# Patient Record
Sex: Female | Born: 1990 | Race: Black or African American | Hispanic: No | Marital: Married | Smoking: Former smoker
Health system: Southern US, Community
[De-identification: ages and names within clinical notes are randomized; demographics above are authoritative.]

## PROBLEM LIST (undated history)

## (undated) DIAGNOSIS — G43909 Migraine, unspecified, not intractable, without status migrainosus: Secondary | ICD-10-CM

## (undated) HISTORY — PX: TUBAL LIGATION: SHX77

---

## 2008-11-28 ENCOUNTER — Emergency Department (HOSPITAL_COMMUNITY): Admission: EM | Admit: 2008-11-28 | Discharge: 2008-11-28 | Payer: Self-pay | Admitting: Emergency Medicine

## 2010-09-21 LAB — URINALYSIS, ROUTINE W REFLEX MICROSCOPIC
Ketones, ur: NEGATIVE mg/dL
Protein, ur: NEGATIVE mg/dL
Urobilinogen, UA: 0.2 mg/dL (ref 0.0–1.0)

## 2010-09-21 LAB — POCT CARDIAC MARKERS
CKMB, poc: <1 ng/mL — ABNORMAL LOW (ref 1.0–8.0)
Troponin i, poc: <0.05 ng/mL (ref 0.00–0.09)

## 2010-09-21 LAB — POCT I-STAT, CHEM 8
BUN: 9 mg/dL (ref 6–23)
Calcium, Ion: 1.22 mmol/L (ref 1.12–1.32)
Chloride: 107 mEq/L (ref 96–112)
Glucose, Bld: 80 mg/dL (ref 70–99)
HCT: 46 % (ref 36.0–46.0)
Potassium: 4.2 mEq/L (ref 3.5–5.1)

## 2010-09-21 LAB — URINE MICROSCOPIC-ADD ON

## 2010-09-21 LAB — D-DIMER, QUANTITATIVE: D-Dimer, Quant: 0.32 ug/mL-FEU (ref 0.00–0.48)

## 2010-09-21 LAB — POCT PREGNANCY, URINE: Preg Test, Ur: NEGATIVE

## 2012-02-20 ENCOUNTER — Emergency Department: Payer: Self-pay | Admitting: *Deleted

## 2012-02-20 LAB — CBC
HCT: 42.6 % (ref 35.0–47.0)
MCH: 27.4 pg (ref 26.0–34.0)
MCV: 84 fL (ref 80–100)
Platelet: 301 10*3/uL (ref 150–440)
RDW: 14.8 % — ABNORMAL HIGH (ref 11.5–14.5)
WBC: 16.5 10*3/uL — ABNORMAL HIGH (ref 3.6–11.0)

## 2012-02-20 LAB — BASIC METABOLIC PANEL
BUN: 9 mg/dL (ref 7–18)
Chloride: 105 mmol/L (ref 98–107)
EGFR (Non-African Amer.): >60
Glucose: 93 mg/dL (ref 65–99)
Potassium: 3.4 mmol/L — ABNORMAL LOW (ref 3.5–5.1)
Sodium: 139 mmol/L (ref 136–145)

## 2012-02-20 LAB — URINALYSIS, COMPLETE
Bilirubin,UR: NEGATIVE
Nitrite: POSITIVE
Ph: 5 (ref 4.5–8.0)

## 2012-02-22 LAB — URINE CULTURE

## 2012-10-19 ENCOUNTER — Inpatient Hospital Stay: Payer: Self-pay | Admitting: Specialist

## 2012-10-19 LAB — COMPREHENSIVE METABOLIC PANEL
Alkaline Phosphatase: 86 U/L (ref 50–136)
BUN: 16 mg/dL (ref 7–18)
Bilirubin,Total: 0.5 mg/dL (ref 0.2–1.0)
Calcium, Total: 8.8 mg/dL (ref 8.5–10.1)
Creatinine: 1.15 mg/dL (ref 0.60–1.30)
SGOT(AST): 29 U/L (ref 15–37)
SGPT (ALT): 22 U/L (ref 12–78)
Sodium: 136 mmol/L (ref 136–145)
Total Protein: 8.4 g/dL — ABNORMAL HIGH (ref 6.4–8.2)

## 2012-10-19 LAB — URINALYSIS, COMPLETE
Bilirubin,UR: NEGATIVE
Glucose,UR: NEGATIVE mg/dL (ref 0–75)
Ph: 6 (ref 4.5–8.0)
WBC UR: 194 /HPF (ref 0–5)

## 2012-10-19 LAB — CBC
Platelet: 220 10*3/uL (ref 150–440)
RBC: 4.27 10*6/uL (ref 3.80–5.20)
RDW: 14.5 % (ref 11.5–14.5)

## 2012-10-19 LAB — LIPASE, BLOOD: Lipase: 121 U/L (ref 73–393)

## 2012-10-19 LAB — PREGNANCY, URINE: Pregnancy Test, Urine: NEGATIVE m[IU]/mL

## 2012-10-20 LAB — CBC WITH DIFFERENTIAL/PLATELET
Basophil #: 0 10*3/uL (ref 0.0–0.1)
Basophil %: 0.1 %
Eosinophil #: 0.3 10*3/uL (ref 0.0–0.7)
HGB: 11.6 g/dL — ABNORMAL LOW (ref 12.0–16.0)
Lymphocyte #: 1.4 10*3/uL (ref 1.0–3.6)
Lymphocyte %: 10.6 %
MCH: 29.9 pg (ref 26.0–34.0)
MCV: 85 fL (ref 80–100)
Monocyte #: 1 x10 3/mm — ABNORMAL HIGH (ref 0.2–0.9)
Monocyte %: 7.9 %
Platelet: 190 10*3/uL (ref 150–440)
RDW: 14.4 % (ref 11.5–14.5)
WBC: 12.9 10*3/uL — ABNORMAL HIGH (ref 3.6–11.0)

## 2012-10-20 LAB — BASIC METABOLIC PANEL
Chloride: 112 mmol/L — ABNORMAL HIGH (ref 98–107)
Creatinine: 0.77 mg/dL (ref 0.60–1.30)
EGFR (African American): >60
EGFR (Non-African Amer.): >60
Glucose: 91 mg/dL (ref 65–99)
Sodium: 142 mmol/L (ref 136–145)

## 2012-10-21 LAB — CBC WITH DIFFERENTIAL/PLATELET
Basophil #: 0 10*3/uL (ref 0.0–0.1)
Basophil %: 0.1 %
HCT: 29.8 % — ABNORMAL LOW (ref 35.0–47.0)
Lymphocyte #: 1.9 10*3/uL (ref 1.0–3.6)
MCHC: 34.6 g/dL (ref 32.0–36.0)
Monocyte %: 12.9 %
Neutrophil %: 71 %
Platelet: 169 10*3/uL (ref 150–440)
RBC: 3.53 10*6/uL — ABNORMAL LOW (ref 3.80–5.20)
RDW: 14.5 % (ref 11.5–14.5)

## 2012-10-21 LAB — URINE CULTURE

## 2012-10-25 LAB — CULTURE, BLOOD (SINGLE)

## 2013-11-13 ENCOUNTER — Emergency Department: Payer: Self-pay | Admitting: Emergency Medicine

## 2013-11-13 LAB — CBC WITH DIFFERENTIAL/PLATELET
BASOS ABS: 0 10*3/uL (ref 0.0–0.1)
BASOS PCT: 0.4 %
EOS ABS: 0.3 10*3/uL (ref 0.0–0.7)
EOS PCT: 3.3 %
HCT: 38.6 % (ref 35.0–47.0)
HGB: 12.9 g/dL (ref 12.0–16.0)
LYMPHS PCT: 13.2 %
Lymphocyte #: 1.4 10*3/uL (ref 1.0–3.6)
MCH: 29.7 pg (ref 26.0–34.0)
MCHC: 33.5 g/dL (ref 32.0–36.0)
MCV: 89 fL (ref 80–100)
Monocyte #: 0.9 x10 3/mm (ref 0.2–0.9)
Monocyte %: 8.8 %
NEUTROS ABS: 7.9 10*3/uL — AB (ref 1.4–6.5)
NEUTROS PCT: 74.3 %
Platelet: 264 10*3/uL (ref 150–440)
RBC: 4.34 10*6/uL (ref 3.80–5.20)
RDW: 14.4 % (ref 11.5–14.5)
WBC: 10.6 10*3/uL (ref 3.6–11.0)

## 2013-11-13 LAB — COMPREHENSIVE METABOLIC PANEL
ALBUMIN: 3.9 g/dL (ref 3.4–5.0)
ALK PHOS: 69 U/L
ANION GAP: 3 — AB (ref 7–16)
AST: 26 U/L (ref 15–37)
BILIRUBIN TOTAL: 0.5 mg/dL (ref 0.2–1.0)
BUN: 8 mg/dL (ref 7–18)
CHLORIDE: 108 mmol/L — AB (ref 98–107)
CO2: 28 mmol/L (ref 21–32)
CREATININE: 0.95 mg/dL (ref 0.60–1.30)
Calcium, Total: 8.7 mg/dL (ref 8.5–10.1)
EGFR (African American): >60
Glucose: 91 mg/dL (ref 65–99)
Osmolality: 275 (ref 275–301)
POTASSIUM: 4.1 mmol/L (ref 3.5–5.1)
SGPT (ALT): 21 U/L (ref 12–78)
SODIUM: 139 mmol/L (ref 136–145)
Total Protein: 7.9 g/dL (ref 6.4–8.2)

## 2013-11-13 LAB — LIPASE, BLOOD: Lipase: 102 U/L (ref 73–393)

## 2013-11-14 LAB — URINALYSIS, COMPLETE
BACTERIA: NONE SEEN
Bilirubin,UR: NEGATIVE
GLUCOSE, UR: NEGATIVE mg/dL (ref 0–75)
KETONE: NEGATIVE
Leukocyte Esterase: NEGATIVE
Nitrite: NEGATIVE
Ph: 7 (ref 4.5–8.0)
Protein: NEGATIVE
RBC,UR: 2 /HPF (ref 0–5)
Specific Gravity: 1.01 (ref 1.003–1.030)

## 2014-05-12 ENCOUNTER — Emergency Department: Payer: Self-pay | Admitting: Emergency Medicine

## 2014-05-12 LAB — URINALYSIS, COMPLETE
BILIRUBIN, UR: NEGATIVE
BLOOD: NEGATIVE
Glucose,UR: NEGATIVE mg/dL (ref 0–75)
Ketone: NEGATIVE
NITRITE: NEGATIVE
PH: 5 (ref 4.5–8.0)
PROTEIN: NEGATIVE
Specific Gravity: 1.026 (ref 1.003–1.030)
Squamous Epithelial: 5

## 2014-05-12 LAB — PREGNANCY, URINE: Pregnancy Test, Urine: NEGATIVE m[IU]/mL

## 2014-10-01 IMAGING — CT CT ABD-PELV W/O CM
1 of 2 series · 15 of 32 positions shown, 19 images · non-contrast
Comparison: None

REASON FOR EXAM: (1) Back pain- UTI; (2) Lower abdominal pain- UTI
COMMENTS:

PROCEDURE:     CT  - CT ABDOMEN AND PELVIS W[DATE]  [DATE]
RESULT:     Indication: Flank Pain
TECHNIQUE: Multiple axial images from the lung bases to the symphysis pubis
were obtained without oral and without intravenous contrast.

[Series 2: 3mm soft tissue · axial · 0.69mm/px · z∈[+357,+753]mm · 15 of 146 slices shown, 19 images]
[im 7/146  soft-tissue]
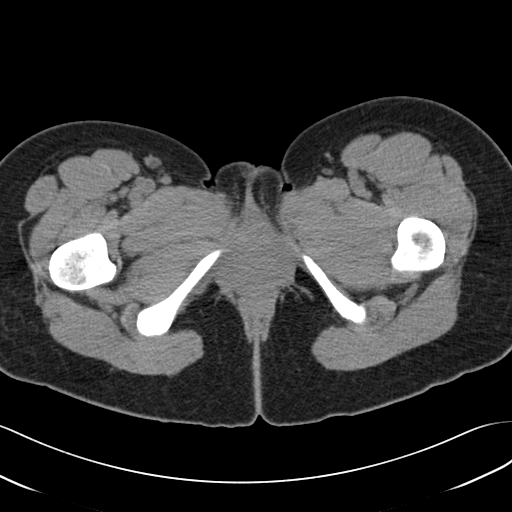
[im 7/146  bone]
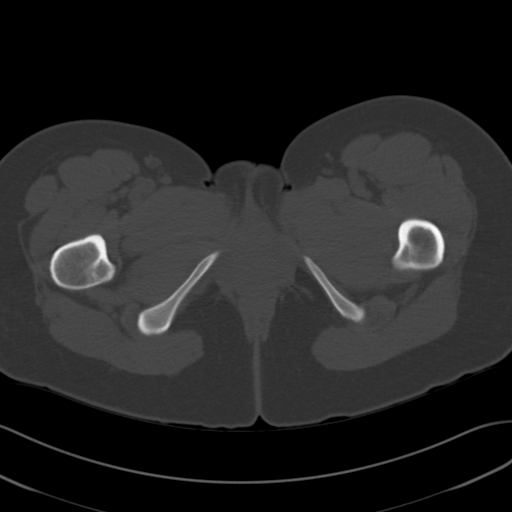
[im 19/146  soft-tissue]
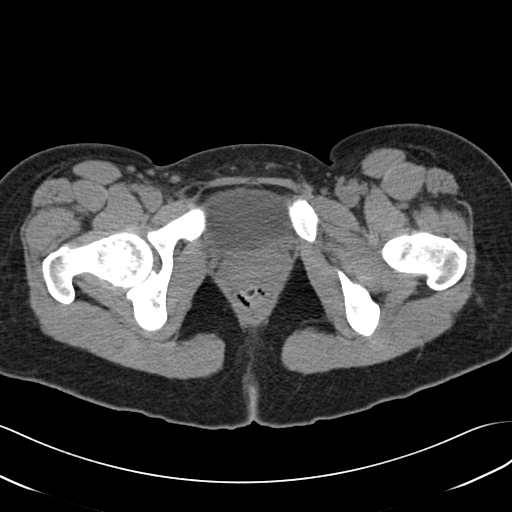
[im 32/146  soft-tissue]
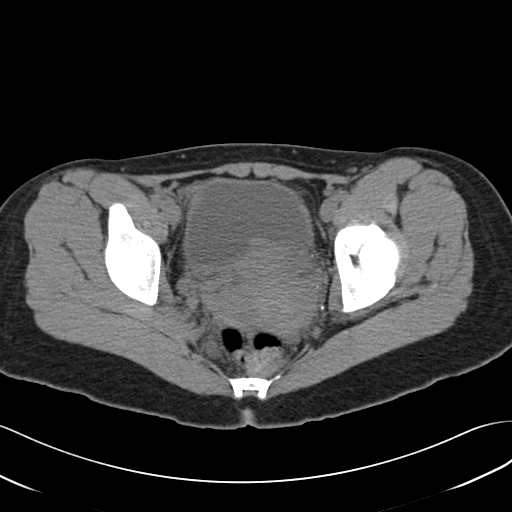
[im 38/146  soft-tissue]
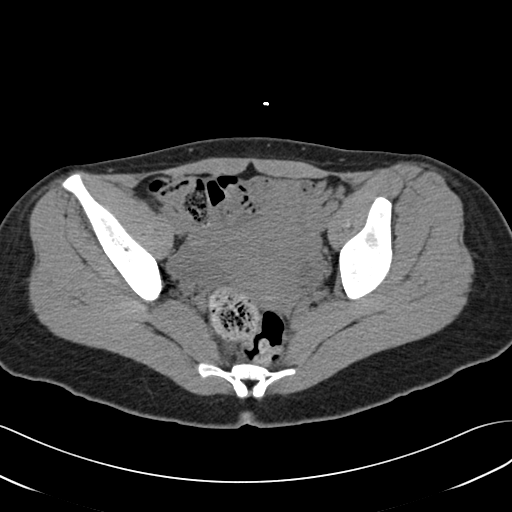
[im 51/146  soft-tissue]
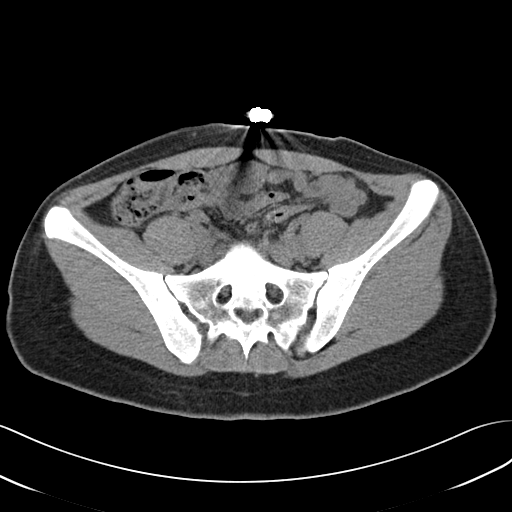
[im 64/146  soft-tissue]
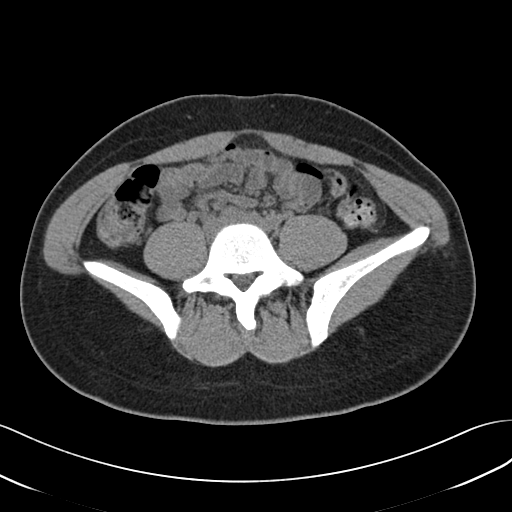
[im 76/146  soft-tissue]
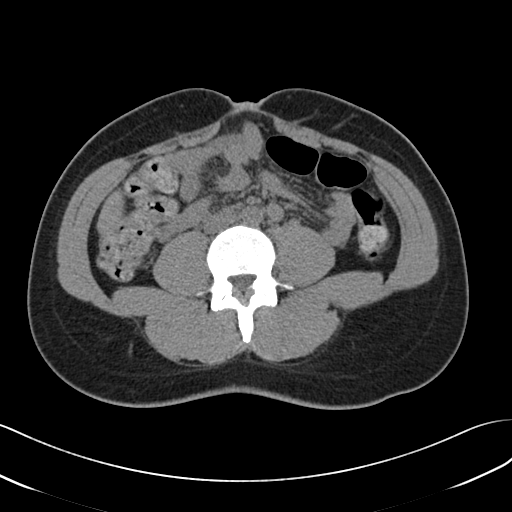
[im 82/146  soft-tissue]
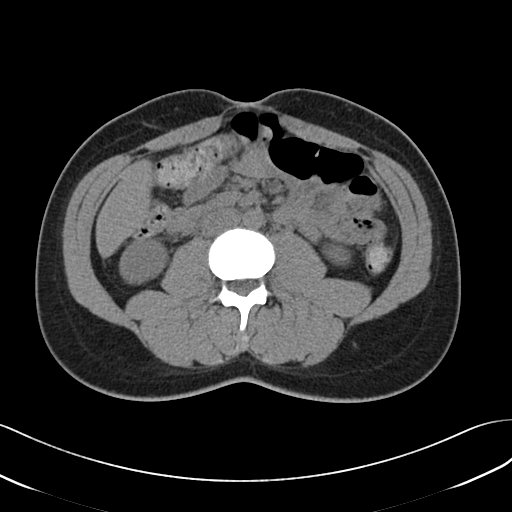
[im 95/146  soft-tissue]
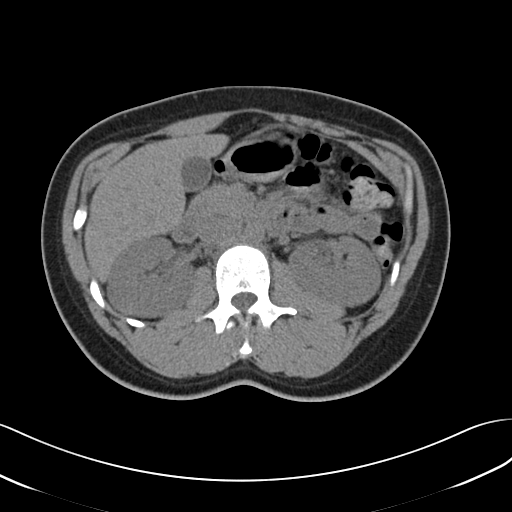
[im 95/146  bone]
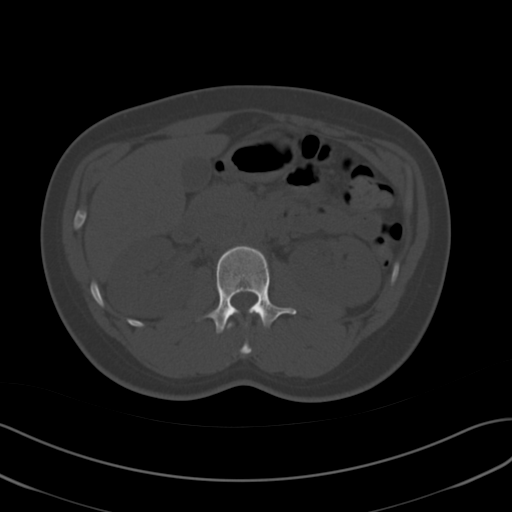
[im 108/146  soft-tissue]
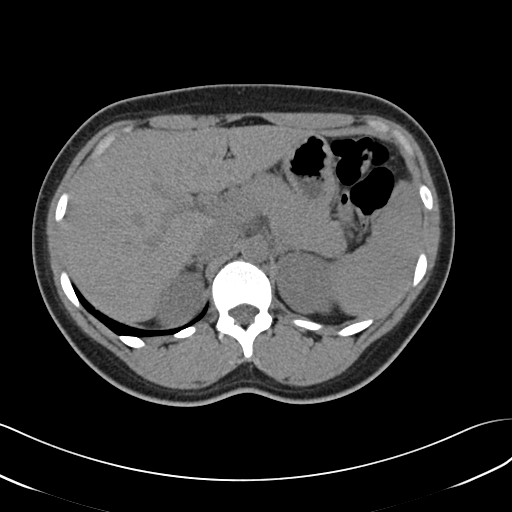
[im 114/146  soft-tissue]
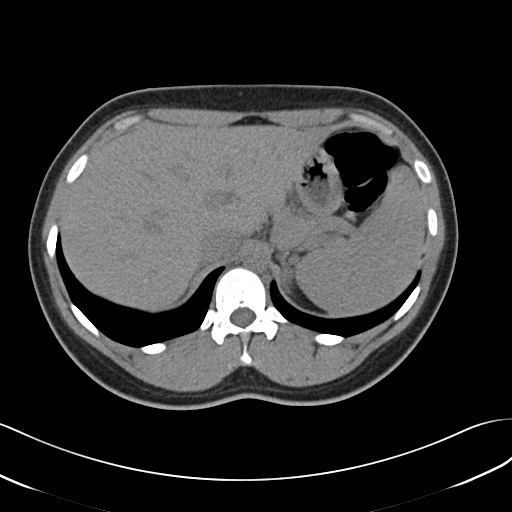
[im 120/146  lung]
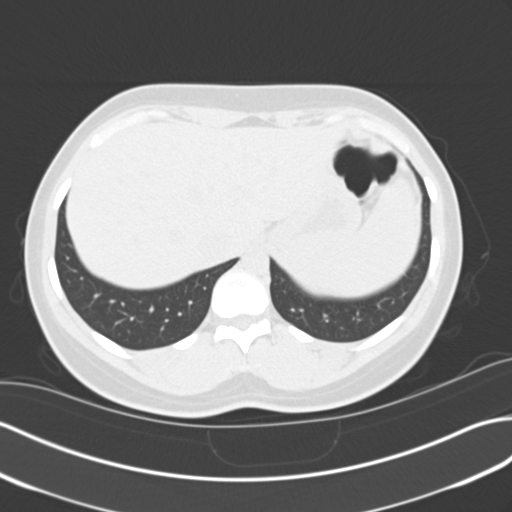
[im 127/146  soft-tissue]
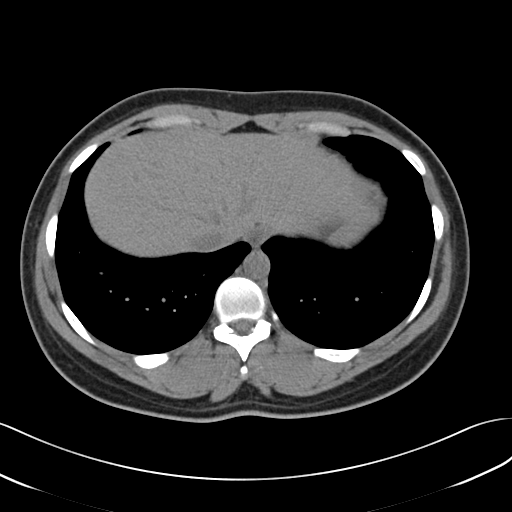
[im 127/146  lung]
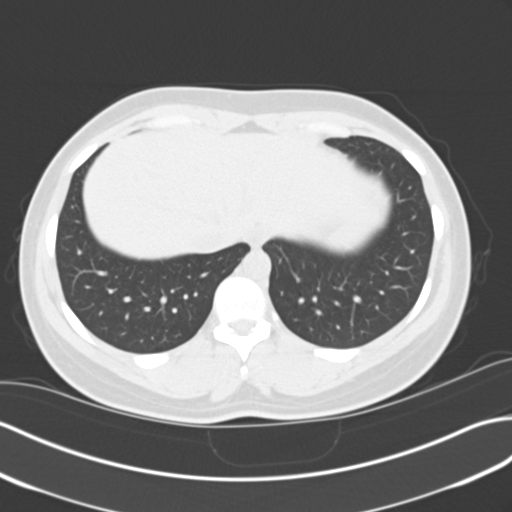
[im 133/146  lung]
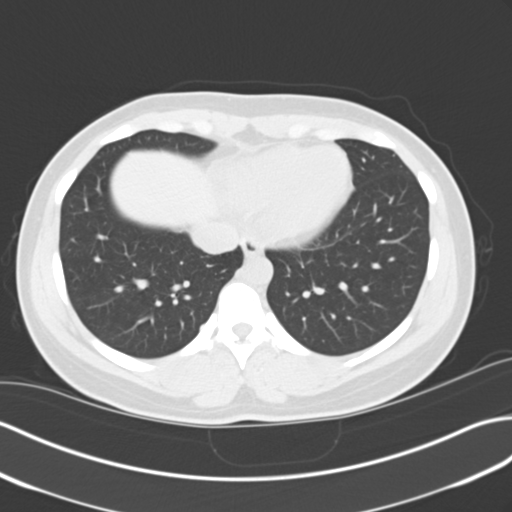
[im 139/146  soft-tissue]
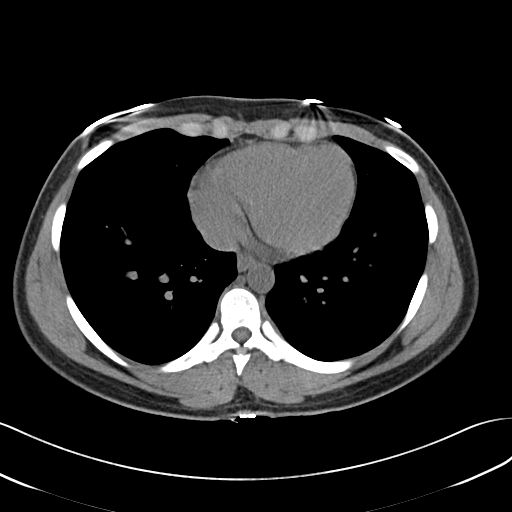
[im 139/146  lung]
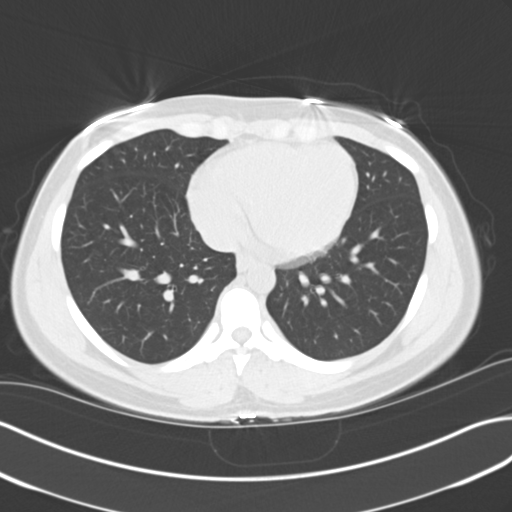

[15 of 32 positions shown; findings below may reference images not displayed]

FINDINGS: The lung bases are clear. There is no pleural or pericardial effusions.

No renal, ureteral, or bladder calculi. No obstructive uropathy. No
perinephric stranding is seen. The kidneys are symmetric in size without
evidence for exophytic mass. The bladder is unremarkable.

The liver demonstrates no focal abnormality. The gallbladder is
unremarkable. The spleen demonstrates no focal abnormality. The adrenal
glands and pancreas are normal.

The unopacified stomach, duodenum, small intestine, and large intestine are
unremarkable, but evaluation is limited by lack of oral contrast.  There is
no pneumoperitoneum, pneumatosis, or portal venous gas. There is small
amount of pelvic free fluid which may be physiologic. There is no
lymphadenopathy.

The abdominal aorta is normal in caliber .

The osseous structures are unremarkable.
IMPRESSION: 1. No urolithiasis or obstructive uropathy.

[REDACTED]

## 2014-10-04 NOTE — Discharge Summary (Signed)
PATIENT NAME:  Gail Mcmahon, Gail Mcmahon MR#:  [id number] DATE OF BIRTH:  01-11-1991  DATE OF ADMISSION:  10/19/2012 DATE OF DISCHARGE:  10/21/2012  For detailed note, please take a look at the history and physical done on admission by  Dr. Elisabeth PigeonVachhani.   DIAGNOSES AT DISCHARGE: Are as follows: Acute pyelonephritis, leukocytosis, secondary to acute pyelonephritis, fever secondary to acute polyarthritis.   The patient is being discharged on a regular diet.   ACTIVITY: As tolerated.   FOLLOWUP: With her primary care physician at [facility] the next 1 to 2 weeks.   DISCHARGE MEDICATIONS: Cipro 500 mg b.i.d. x 10 days, and Tylenol 650 q. 4 to 6 hours as needed for pain or fever of greater than 101.   PERTINENT STUDIES DONE DURING THE HOSPITAL COURSE: Are as follows: A CAT scan of the abdomen and pelvis done without contrast showing no urolithiasis or obstructive uropathy. No evidence of any fluid collections or abscess.   ASSESSMENT AND PLAN: This is a 24 year old female who presented to the hospital on 10/19/2012 secondary to abdominal pain, nausea, vomiting and fever and noted to have an abnormal urinalysis.   1.  Acute pyelonephritis: This was likely the cause of the patient's fever, nausea, abdominal pain. The patient had an abnormal urinalysis. Had failed p.o. antibiotic therapy for her UTI. She was admitted to the hospital, started on IV antibiotics and IV fluids, antiemetics and pain control. Her urinalysis grew out E. coli, which was pansensitive. Her blood cultures have remained negative. After getting 2 days of IV antibiotics and fluids the patient had significantly improved. She now is now being discharged on some p.o. ciprofloxacin and Tylenol for pain or fever.   The patient was also noted to have a leukocytosis of 16,000, which has now normalized to 12,000.  2.  Fever: This was likely related to the acute pyelonephritis. The patient still continues to have some fever spikes, even on the day  of discharge, although she is hemodynamically stable, does not show any evidence of sepsis as blood cultures are negative. It is common to have fever spikes with acute pyelonephritis for up to weeks;  this is normal for the course. The patient will continue her p.o. ciprofloxacin and Tylenol as needed.   THE PATIENT IS A FULL CODE.   Time spent on discharge is 35 minutes.    ____________________________ Rolly PancakeVivek J. Cherlynn KaiserSainani, MD vjs:dm D: 10/21/2012 12:06:12 ET T: 10/22/2012 07:53:22 ET JOB#: [id number]  cc: Rolly PancakeVivek J. Cherlynn KaiserSainani, MD, <Dictator> [facility] [place] SirenVIVEK J SAINANI MD ELECTRONICALLY SIGNED 10/31/2012 19:56

## 2014-10-04 NOTE — H&P (Signed)
PATIENT NAME:  Gail Mcmahon, Gail Mcmahon MR#:  [id number] DATE OF BIRTH:  June 25, 1990  DATE OF ADMISSION:  10/19/2012  REFERRING PHYSICIAN: Dr. Margarita Grizzle  CHIEF COMPLAINT: Back pain and fever,   HISTORY OF PRESENT ILLNESS: The patient is a 24 year old female with no known past medical history started having complaint of back pain and lower abdominal pain on the right side for the last 2 to 3 days and also noted simultaneously that her urine is smelling more intense and funny and is cloudy looking and started having fever today, so decided to come to the Emergency Room for further work-up. On initial work-up, she was found to have a urinary tract infection with tachycardia, fever 101 degrees Fahrenheit in the ER and elevated white cell count. She has complaint of mild nausea also, so she is being admitted for treatment of urinary tract infection with sepsis, possible pyelonephritis for IV antibiotic, due to complaint of nausea and  infection.   REVIEW OF SYSTEMS:  GENERAL: Positive for fever.  No weakness, pain or weight loss.  EYES: No blurring, double vision or redness.  EARS, NOSE, THROAT: No tinnitus, ear pain or hearing loss.  RESPIRATORY: No cough, wheezing or shortness of breath.  CARDIOVASCULAR: No chest pain, orthopnea or arrhythmia.  GASTROINTESTINAL: Had some nausea but no vomiting, diarrhea, have a mild right lower abdominal pain and right back pain.  GENITOURINARY: She has dysuria, no hematuria and has cloudy looking urine.  ENDOCRINE: No heat or cold intolerance.  MUSCULOSKELETAL: No pain in any joints or no swelling of the joints.  NEUROLOGICAL: No numbness, weakness or headache, but has complaint of continuous shaking with fever  PSYCHIATRIC: No anxiety, insomnia or bipolar disorder.   PAST MEDICAL HISTORY: None.   PAST SURGICAL HISTORY: None.   ALLERGIES: None.   HOME MEDICATIONS: None.   FAMILY HISTORY: Positive for diabetes in grandmother.   PHYSICAL EXAMINATION: VITAL SIGNS: In  ER: Temperature 101.9 degrees Fahrenheit, pulse rate 116, respirations 24, blood pressure 127/71, which came down to 104/52.  GENERAL: She is fully alert, oriented to time, place and person and does not have any acute distress. Cooperative with history taking and physical examination.  HEENT: Head and neck atraumatic. Conjunctivae pink.  NECK: Supple. No JVD.  RESPIRATORY: Bilateral clear and equal air entry.  CARDIOVASCULAR: S1, S2 present. Tachycardia. Regular. No murmur appreciated.  ABDOMEN: Mild tenderness in right lower and on minimal tapping on right costovertebral angle, guarding and CVA tenderness present. Bowel sounds present.  SKIN: No rashes. Legs: No edema.  NEUROLOGICAL: Power 5/5 in all 4 limbs. No tremors.  PSYCHIATRIC: Does not appear in any acute psychiatric illness.   LABORATORY, DIAGNOSTIC AND RADIOLOGIC DATA:  Pregnancy test negative. Glucose 99 BUN 16, creatinine 1.15, sodium 136, potassium 3.6, chloride 107, CO2 22, calcium 8.8, lipase 121. Total WBC count 16,600, hemoglobin 12.1, platelet count 220 and MCV 85.   Urinalysis: Color yellow, clarity is hazy and leukocyte esterase 3+, WBC 194.   ASSESSMENT AND PLAN: A 24 year old female came with urinary tract infection and also having complaint of right back and lower abdominal pain with fever and shaking and mild nausea; having sepsis, tachycardia, fever and raised white cell count secondary to urinary tract infection.   PLAN:  1.  Sepsis due to urinary tract infection, possible pyelonephritis. We will do CT of the abdomen and pelvis to see for pyelonephritis and to rule out any renal stones or any other pathology. She is already started on IV Rocephin by ER,  we will continue that. Urine culture and blood cultures are sent off. We need to follow that.  2.  Tachycardia and, sepsis. We will give her IV fluids and monitor clinically.  3.  Nausea. Zofran as needed.  5.  Fever and pain. Tylenol as needed.   TOTAL TIME SPENT: 45  minutes    ____________________________ Gail PigeonVaibhavkumar G. Elisabeth PigeonVachhani, MD vgv:cc D: 10/19/2012 21:33:32 ET T: 10/19/2012 22:15:11 ET JOB#: [id number]  cc: Gail PigeonVaibhavkumar G. Elisabeth PigeonVachhani, MD, <Dictator> Altamese DillingVAIBHAVKUMAR Catherine Oak MD ELECTRONICALLY SIGNED 10/26/2012 6:20

## 2014-11-08 ENCOUNTER — Ambulatory Visit
Admission: EM | Admit: 2014-11-08 | Discharge: 2014-11-08 | Disposition: A | Discharge status: Home / Self Care | Payer: 59 | Attending: Family Medicine | Admitting: Family Medicine

## 2014-11-08 ENCOUNTER — Encounter: Payer: Self-pay | Admitting: Gynecology

## 2014-11-08 DIAGNOSIS — N390 Urinary tract infection, site not specified: Secondary | ICD-10-CM | POA: Diagnosis not present

## 2014-11-08 DIAGNOSIS — F17210 Nicotine dependence, cigarettes, uncomplicated: Secondary | ICD-10-CM | POA: Diagnosis not present

## 2014-11-08 LAB — URINALYSIS COMPLETE WITH MICROSCOPIC (ARMC ONLY)
Glucose, UA: NEGATIVE mg/dL
HGB URINE DIPSTICK: NEGATIVE
NITRITE: NEGATIVE
PH: 5.5 (ref 5.0–8.0)
Protein, ur: 30 mg/dL — AB
SPECIFIC GRAVITY, URINE: 1.03 (ref 1.005–1.030)

## 2014-11-08 MED ORDER — CIPROFLOXACIN HCL 250 MG PO TABS: 250.0000 mg | ORAL_TABLET | Freq: Two times a day (BID) | ORAL | Status: AC

## 2014-11-08 NOTE — ED Provider Notes (Signed)
CSN: [id number]     Arrival date & time 11/08/14  1645 History   First MD Initiated Contact with Patient 11/08/14 1718     Chief Complaint  Patient presents with  . Urinary Tract Infection   (Consider location/radiation/quality/duration/timing/severity/associated sxs/prior Treatment) Patient is a 24 y.o. female presenting with urinary tract infection. The history is provided by the patient.  Urinary Tract Infection Pain quality:  Burning and stabbing Pain severity:  Moderate Onset quality:  Sudden Duration:  8 hours Timing:  Constant Progression:  Worsening Chronicity:  New Recent urinary tract infections: no   Relieved by:  Nothing Urinary symptoms: discolored urine and frequent urination   Associated symptoms: flank pain   Associated symptoms: no abdominal pain, no fever, no nausea, no vaginal discharge and no vomiting     History reviewed. No pertinent past medical history. Past Surgical History  Procedure Laterality Date  . Tubal ligation     No family history on file. History  Substance Use Topics  . Smoking status: Current Every Day Smoker -- 4.00 packs/day    Types: Cigarettes  . Smokeless tobacco: Not on file  . Alcohol Use: No   OB History    No data available     Review of Systems  Constitutional: Negative for fever.  Gastrointestinal: Negative for nausea, vomiting and abdominal pain.  Genitourinary: Positive for flank pain. Negative for vaginal discharge.    Allergies  Review of patient's allergies indicates no known allergies.  Home Medications   Prior to Admission medications   Medication Sig Start Date End Date Taking? Authorizing Provider  ciprofloxacin (CIPRO) 250 MG tablet Take 1 tablet (250 mg total) by mouth every 12 (twelve) hours. 11/08/14   Payton Mccallumrlando Zuma Hust, MD   BP 106/63 mmHg  Pulse 73  Temp(Src) 97.9 F (36.6 C) (Oral)  Ht 5\' 3"  (1.6 m)  Wt 167 lb (75.751 kg)  BMI 29.59 kg/m2  SpO2 100%  LMP 10/22/2014 Physical Exam   Constitutional: She appears well-developed and well-nourished. No distress.  Abdominal: Soft. Bowel sounds are normal. She exhibits no distension and no mass. There is tenderness (mild suprapubic ). There is no rebound and no guarding.  Skin: She is not diaphoretic.  Nursing note and vitals reviewed.   ED Course  Procedures (including critical care time) Labs Review Labs Reviewed  URINALYSIS COMPLETEWITH MICROSCOPIC ([facility] ONLY) - Abnormal; Notable for the following:    Color, Urine AMBER (*)    APPearance CLOUDY (*)    Bilirubin Urine 1+ (*)    Ketones, ur TRACE (*)    Protein, ur 30 (*)    Leukocytes, UA TRACE (*)    Bacteria, UA FEW (*)    Squamous Epithelial / LPF TOO NUMEROUS TO COUNT (*)    All other components within normal limits    Imaging Review No results found.   MDM   1. UTI (lower urinary tract infection)     Discharge Medication List as of 11/08/2014  6:08 PM    START taking these medications   Details  ciprofloxacin (CIPRO) 250 MG tablet Take 1 tablet (250 mg total) by mouth every 12 (twelve) hours., Starting 11/08/2014, Until Discontinued, Normal      Plan: 1. Test/x-ray results and diagnosis reviewed with patient 2. rx as per orders; risks, benefits, potential side effects reviewed with patient 3. Recommend supportive treatment with increased fluids 4. F/u prn if symptoms worsen or don't improve   Payton Mccallumrlando Azrael Maddix, MD 11/10/14 [id number]

## 2014-11-08 NOTE — ED Notes (Signed)
Pt. C/o uniary problems. Pt. Stated low back pain / frequency and painful urination x today.

## 2015-01-16 DIAGNOSIS — G43809 Other migraine, not intractable, without status migrainosus: Secondary | ICD-10-CM | POA: Insufficient documentation

## 2015-01-16 DIAGNOSIS — N390 Urinary tract infection, site not specified: Secondary | ICD-10-CM | POA: Diagnosis not present

## 2015-01-16 DIAGNOSIS — Z3202 Encounter for pregnancy test, result negative: Secondary | ICD-10-CM | POA: Insufficient documentation

## 2015-01-16 DIAGNOSIS — M545 Low back pain: Secondary | ICD-10-CM | POA: Diagnosis present

## 2015-01-16 DIAGNOSIS — Z720 Tobacco use: Secondary | ICD-10-CM | POA: Insufficient documentation

## 2015-01-17 ENCOUNTER — Encounter: Payer: Self-pay | Admitting: Urgent Care

## 2015-01-17 ENCOUNTER — Emergency Department
Admission: EM | Admit: 2015-01-17 | Discharge: 2015-01-17 | Disposition: A | Discharge status: Home / Self Care | Payer: 59 | Attending: Emergency Medicine | Admitting: Emergency Medicine

## 2015-01-17 DIAGNOSIS — G43809 Other migraine, not intractable, without status migrainosus: Secondary | ICD-10-CM

## 2015-01-17 DIAGNOSIS — N390 Urinary tract infection, site not specified: Secondary | ICD-10-CM

## 2015-01-17 HISTORY — DX: Migraine, unspecified, not intractable, without status migrainosus: G43.909

## 2015-01-17 LAB — URINALYSIS COMPLETE WITH MICROSCOPIC (ARMC ONLY)
BILIRUBIN URINE: NEGATIVE
GLUCOSE, UA: NEGATIVE mg/dL
Ketones, ur: NEGATIVE mg/dL
Nitrite: NEGATIVE
PH: 6 (ref 5.0–8.0)
PROTEIN: NEGATIVE mg/dL
Specific Gravity, Urine: 1.012 (ref 1.005–1.030)

## 2015-01-17 LAB — POCT PREGNANCY, URINE: Preg Test, Ur: NEGATIVE

## 2015-01-17 MED ORDER — PROCHLORPERAZINE EDISYLATE 5 MG/ML IJ SOLN
10.0000 mg | Freq: Four times a day (QID) | INTRAMUSCULAR | Status: DC | PRN
  Administered 2015-01-17: 10 mg via INTRAVENOUS
  Filled 2015-01-17: qty 2

## 2015-01-17 MED ORDER — CEPHALEXIN 500 MG PO CAPS: 500.0000 mg | ORAL_CAPSULE | Freq: Three times a day (TID) | ORAL | Status: AC

## 2015-01-17 MED ORDER — DEXTROSE 5 % IV SOLN
1.0000 g | Freq: Once | INTRAVENOUS | Status: AC
  Administered 2015-01-17: 1 g via INTRAVENOUS
  Filled 2015-01-17: qty 10

## 2015-01-17 MED ORDER — SODIUM CHLORIDE 0.9 % IV BOLUS (SEPSIS)
1000.0000 mL | Freq: Once | INTRAVENOUS | Status: AC
  Administered 2015-01-17: 1000 mL via INTRAVENOUS

## 2015-01-17 NOTE — ED Notes (Signed)
Patient present with c/o lower back pain with (+) nausea x 2 -3 days. Patient began to have a migraine headache earlier today; similar to previous.

## 2015-01-17 NOTE — Discharge Instructions (Signed)
Please seek medical attention for any high fevers, chest pain, shortness of breath, change in behavior, persistent vomiting, bloody stool or any other new or concerning symptoms. ° °Urinary Tract Infection °Urinary tract infections (UTIs) can develop anywhere along your urinary tract. Your urinary tract is your body's drainage system for removing wastes and extra water. Your urinary tract includes two kidneys, two ureters, a bladder, and a urethra. Your kidneys are a pair of bean-shaped organs. Each kidney is about the size of your fist. They are located below your ribs, one on each side of your spine. °CAUSES °Infections are caused by microbes, which are microscopic organisms, including fungi, viruses, and bacteria. These organisms are so small that they can only be seen through a microscope. Bacteria are the microbes that most commonly cause UTIs. °SYMPTOMS  °Symptoms of UTIs may vary by age and gender of the patient and by the location of the infection. Symptoms in young women typically include a frequent and intense urge to urinate and a painful, burning feeling in the bladder or urethra during urination. Older women and men are more likely to be tired, shaky, and weak and have muscle aches and abdominal pain. A fever may mean the infection is in your kidneys. Other symptoms of a kidney infection include pain in your back or sides below the ribs, nausea, and vomiting. °DIAGNOSIS °To diagnose a UTI, your caregiver will ask you about your symptoms. Your caregiver also will ask to provide a urine sample. The urine sample will be tested for bacteria and white blood cells. White blood cells are made by your body to help fight infection. °TREATMENT  °Typically, UTIs can be treated with medication. Because most UTIs are caused by a bacterial infection, they usually can be treated with the use of antibiotics. The choice of antibiotic and length of treatment depend on your symptoms and the type of bacteria causing your  infection. °HOME CARE INSTRUCTIONS °· If you were prescribed antibiotics, take them exactly as your caregiver instructs you. Finish the medication even if you feel better after you have only taken some of the medication. °· Drink enough water and fluids to keep your urine clear or pale yellow. °· Avoid caffeine, tea, and carbonated beverages. They tend to irritate your bladder. °· Empty your bladder often. Avoid holding urine for long periods of time. °· Empty your bladder before and after sexual intercourse. °· After a bowel movement, women should cleanse from front to back. Use each tissue only once. °SEEK MEDICAL CARE IF:  °· You have back pain. °· You develop a fever. °· Your symptoms do not begin to resolve within 3 days. °SEEK IMMEDIATE MEDICAL CARE IF:  °· You have severe back pain or lower abdominal pain. °· You develop chills. °· You have nausea or vomiting. °· You have continued burning or discomfort with urination. °MAKE SURE YOU:  °· Understand these instructions. °· Will watch your condition. °· Will get help right away if you are not doing well or get worse. °Document Released: 03/10/2005 Document Revised: 11/30/2011 Document Reviewed: 07/09/2011 °Exit[employer]® Patient Information ©2015 [employer]. This information is not intended to replace advice given to you by your health care provider. Make sure you discuss any questions you have with your health care provider. ° °

## 2015-01-17 NOTE — ED Provider Notes (Signed)
[facility] Emergency Department Provider Note   ____________________________________________  Time seen: 0135  I have reviewed the triage vital signs and the nursing notes.   HISTORY  Chief Complaint Migraine; Nausea; and Back Pain   History limited by: Not Limited   HPI Gail Mcmahon is a 24 y.o. female who presents to the emergency department today because of concerns for back pain, nausea and migraine. She states she's been having back pain for the past 2-3 days. It has been caused. Is been located in the lower back. She says it is an aching pain. In addition she states she developed a migraine today. She does have a history of migraines. She tried taking the Excedrin without any relief. She denies any fevers.     Past Medical History  Diagnosis Date  . Migraine     There are no active problems to display for this patient.   Past Surgical History  Procedure Laterality Date  . Tubal ligation      Current Outpatient Rx  Name  Route  Sig  Dispense  Refill  . ciprofloxacin (CIPRO) 250 MG tablet   Oral   Take 1 tablet (250 mg total) by mouth every 12 (twelve) hours.   6 tablet   0     Allergies Review of patient's allergies indicates no known allergies.  No family history on file.  Social History History  Substance Use Topics  . Smoking status: Current Every Day Smoker -- 4.00 packs/day    Types: Cigarettes  . Smokeless tobacco: Not on file  . Alcohol Use: No    Review of Systems  Constitutional: Negative for fever. Cardiovascular: Negative for chest pain. Respiratory: Negative for shortness of breath. Gastrointestinal: Negative for abdominal pain, vomiting and diarrhea. Genitourinary: Negative for dysuria. Musculoskeletal: Positive for low back pain Skin: Negative for rash. Neurological: Negative for headaches, focal weakness or numbness.   10-point ROS otherwise  negative.  ____________________________________________   PHYSICAL EXAM:  VITAL SIGNS: ED Triage Vitals  Enc Vitals Group     BP 01/17/15 0010 121/72 mmHg     Pulse Rate 01/17/15 0010 62     Resp --      Temp 01/17/15 0010 98.7 F (37.1 C)     Temp Source 01/17/15 0010 Oral     SpO2 01/17/15 0010 100 %     Weight 01/17/15 0010 150 lb (68.04 kg)     Height 01/17/15 0010  (1.626 m)   Constitutional: Alert and oriented. Well appearing and in no distress. Eyes: Conjunctivae are normal. PERRL. Normal extraocular movements. ENT   Head: Normocephalic and atraumatic.   Nose: No congestion/rhinnorhea.   Mouth/Throat: Mucous membranes are moist.   Neck: No stridor. Hematological/Lymphatic/Immunilogical: No cervical lymphadenopathy. Cardiovascular: Normal rate, regular rhythm.  No murmurs, rubs, or gallops. Respiratory: Normal respiratory effort without tachypnea nor retractions. Breath sounds are clear and equal bilaterally. No wheezes/rales/rhonchi. Gastrointestinal: Soft and nontender. No distention. Mild bilateral CVA tenderness.  Genitourinary: Deferred Musculoskeletal: Normal range of motion in all extremities. No joint effusions.  No lower extremity tenderness nor edema. Neurologic:  Normal speech and language. No gross focal neurologic deficits are appreciated. Speech is normal.  Skin:  Skin is warm, dry and intact. No rash noted. Psychiatric: Mood and affect are normal. Speech and behavior are normal. Patient exhibits appropriate insight and judgment.  ____________________________________________    LABS (pertinent positives/negatives)  Labs Reviewed  URINALYSIS COMPLETEWITH MICROSCOPIC ([facility] ONLY) - Abnormal; Notable for the following:  Color, Urine YELLOW (*)    APPearance CLEAR (*)    Hgb urine dipstick 1+ (*)    Leukocytes, UA 2+ (*)    Bacteria, UA RARE (*)    Squamous Epithelial / LPF 6-30 (*)    All other components within normal limits   POCT PREGNANCY, URINE     ____________________________________________   EKG  None  ____________________________________________    RADIOLOGY  None  ____________________________________________   PROCEDURES  Procedure(s) performed: None  Critical Care performed: No  ____________________________________________   INITIAL IMPRESSION / ASSESSMENT AND PLAN / ED COURSE  Pertinent labs & imaging results that were available during my care of the patient were reviewed by me and considered in my medical decision making (see chart for details).   Patient presents to the emergency department today with low back pain, nausea and migraines. Urinalysis concerning for UTI. I think this likely explains her low back pain and nausea. Will give patient comes in IV for headache. Additionally will give dose of IV antibiotics.  Patient's headache did improve with IV Compazine. Will discharge home with antibiotics ____________________________________________   FINAL CLINICAL IMPRESSION(S) / ED DIAGNOSES  Final diagnoses:  UTI (lower urinary tract infection)  Other type of migraine     Phineas Semen, MD 01/17/15 [phone]

## 2015-01-17 NOTE — ED Notes (Signed)

## 2016-05-17 ENCOUNTER — Emergency Department
Admission: EM | Admit: 2016-05-17 | Discharge: 2016-05-17 | Disposition: A | Discharge status: Home / Self Care | Payer: 59 | Attending: Emergency Medicine | Admitting: Emergency Medicine

## 2016-05-17 ENCOUNTER — Encounter: Payer: Self-pay | Admitting: Emergency Medicine

## 2016-05-17 ENCOUNTER — Emergency Department: Payer: 59

## 2016-05-17 DIAGNOSIS — R1031 Right lower quadrant pain: Secondary | ICD-10-CM | POA: Diagnosis present

## 2016-05-17 DIAGNOSIS — N12 Tubulo-interstitial nephritis, not specified as acute or chronic: Secondary | ICD-10-CM | POA: Diagnosis not present

## 2016-05-17 DIAGNOSIS — Z792 Long term (current) use of antibiotics: Secondary | ICD-10-CM | POA: Insufficient documentation

## 2016-05-17 DIAGNOSIS — Z87891 Personal history of nicotine dependence: Secondary | ICD-10-CM | POA: Diagnosis not present

## 2016-05-17 LAB — COMPREHENSIVE METABOLIC PANEL
ALBUMIN: 4 g/dL (ref 3.5–5.0)
ALT: 17 U/L (ref 14–54)
ANION GAP: 5 (ref 5–15)
AST: 18 U/L (ref 15–41)
Alkaline Phosphatase: 67 U/L (ref 38–126)
BUN: 12 mg/dL (ref 6–20)
CO2: 27 mmol/L (ref 22–32)
Calcium: 9.3 mg/dL (ref 8.9–10.3)
Chloride: 107 mmol/L (ref 101–111)
Creatinine, Ser: 0.82 mg/dL (ref 0.44–1.00)
GFR calc Af Amer: >60 mL/min (ref >60)
GFR calc non Af Amer: >60 mL/min (ref >60)
GLUCOSE: 95 mg/dL (ref 65–99)
POTASSIUM: 3.9 mmol/L (ref 3.5–5.1)
SODIUM: 139 mmol/L (ref 135–145)
Total Bilirubin: 0.5 mg/dL (ref 0.3–1.2)
Total Protein: 7.6 g/dL (ref 6.5–8.1)

## 2016-05-17 LAB — CBC
HCT: 37.9 % (ref 35.0–47.0)
HEMOGLOBIN: 13.1 g/dL (ref 12.0–16.0)
MCH: 30.5 pg (ref 26.0–34.0)
MCHC: 34.6 g/dL (ref 32.0–36.0)
MCV: 88.1 fL (ref 80.0–100.0)
Platelets: 244 10*3/uL (ref 150–440)
RBC: 4.3 MIL/uL (ref 3.80–5.20)
RDW: 13.2 % (ref 11.5–14.5)
WBC: 13.7 10*3/uL — ABNORMAL HIGH (ref 3.6–11.0)

## 2016-05-17 LAB — URINALYSIS COMPLETE WITH MICROSCOPIC (ARMC ONLY)
Bilirubin Urine: NEGATIVE
Glucose, UA: NEGATIVE mg/dL
Ketones, ur: NEGATIVE mg/dL
NITRITE: NEGATIVE
PH: 5 (ref 5.0–8.0)
PROTEIN: 30 mg/dL — AB
Specific Gravity, Urine: 1.013 (ref 1.005–1.030)

## 2016-05-17 LAB — LIPASE, BLOOD: Lipase: 21 U/L (ref 11–51)

## 2016-05-17 LAB — POCT PREGNANCY, URINE: Preg Test, Ur: NEGATIVE

## 2016-05-17 MED ORDER — MORPHINE SULFATE (PF) 4 MG/ML IV SOLN
4.0000 mg | Freq: Once | INTRAVENOUS | Status: AC
  Administered 2016-05-17: 4 mg via INTRAVENOUS

## 2016-05-17 MED ORDER — SODIUM CHLORIDE 0.9 % IV BOLUS (SEPSIS)
1000.0000 mL | Freq: Once | INTRAVENOUS | Status: AC
  Administered 2016-05-17: 1000 mL via INTRAVENOUS

## 2016-05-17 MED ORDER — ONDANSETRON HCL 4 MG/2ML IJ SOLN
4.0000 mg | Freq: Once | INTRAMUSCULAR | Status: AC
  Administered 2016-05-17: 4 mg via INTRAVENOUS

## 2016-05-17 MED ORDER — IOPAMIDOL (ISOVUE-300) INJECTION 61%
30.0000 mL | Freq: Once | INTRAVENOUS | Status: AC | PRN
  Administered 2016-05-17: 30 mL via ORAL
  Filled 2016-05-17: qty 30

## 2016-05-17 MED ORDER — CEPHALEXIN 500 MG PO CAPS
ORAL_CAPSULE | ORAL | Status: AC
  Administered 2016-05-17: 500 mg via ORAL
  Filled 2016-05-17: qty 1

## 2016-05-17 MED ORDER — IOPAMIDOL (ISOVUE-300) INJECTION 61%
100.0000 mL | Freq: Once | INTRAVENOUS | Status: AC | PRN
  Administered 2016-05-17: 100 mL via INTRAVENOUS
  Filled 2016-05-17: qty 100

## 2016-05-17 MED ORDER — CEPHALEXIN 500 MG PO CAPS
500.0000 mg | ORAL_CAPSULE | Freq: Three times a day (TID) | ORAL | 0 refills | Status: AC
Start: 2016-05-17 — End: 2016-05-27

## 2016-05-17 MED ORDER — CEPHALEXIN 500 MG PO CAPS
500.0000 mg | ORAL_CAPSULE | Freq: Once | ORAL | Status: AC
  Administered 2016-05-17: 500 mg via ORAL
  Filled 2016-05-17: qty 1

## 2016-05-17 NOTE — ED Triage Notes (Signed)
Pt to ed with c/o abd pain and cramping right side and lower since 6 am today.  Pt reports nausea, but denies vomiting and diarrhea.

## 2016-05-17 NOTE — ED Notes (Signed)
Pt alert and oriented X4, active, cooperative, pt in NAD. RR even and unlabored, color WNL.  Pt informed to return if any life threatening symptoms occur.   

## 2016-05-17 NOTE — ED Notes (Signed)
Pt drinking oral CT contrast, instructed to let nurse know when finished. Call bell within reach, lights dimmed.

## 2016-05-17 NOTE — ED Notes (Signed)
New onset of abdominal pain this AM, nausea. Normal BM. Pt alert and oriented X4, active, cooperative, pt in NAD. RR even and unlabored, color WNL.

## 2016-05-17 NOTE — ED Notes (Signed)
Patient transported to CT 

## 2016-05-17 NOTE — ED Provider Notes (Signed)
[facility] Emergency Department Provider Note   ____________________________________________   First MD Initiated Contact with Patient 05/17/16 1411     (approximate)  I have reviewed the triage vital signs and the nursing notes.   HISTORY  Chief Complaint Abdominal Pain   HPI Gail Mcmahon is a 25 y.o. female with a history of sepsis from UTI who is presenting with right lower quadrant pain that started this morning at 6 AM upon waking up. She says the pain is sharp and is a 9 out of 10. She says it is radiating around from the right lower quadrant to the right lower back. She denies any urinary frequency or burning. Says that this feels different from her previous UTIs which have been with back pain. She denies any vomiting but does say that she has had nausea. Denies any vaginal bleeding or discharge.   Past Medical History:  Diagnosis Date  . Migraine     There are no active problems to display for this patient.   Past Surgical History:  Procedure Laterality Date  . TUBAL LIGATION      Prior to Admission medications   Medication Sig Start Date End Date Taking? Authorizing Provider  ciprofloxacin (CIPRO) 250 MG tablet Take 1 tablet (250 mg total) by mouth every 12 (twelve) hours. 11/08/14   Payton Mccallumrlando Conty, MD    Allergies Patient has no known allergies.  History reviewed. No pertinent family history.  Social History Social History  Substance Use Topics  . Smoking status: Former Smoker    Types: Cigarettes  . Smokeless tobacco: Never Used  . Alcohol use No    Review of Systems Constitutional: No fever/chills Eyes: No visual changes. ENT: No sore throat. Cardiovascular: Denies chest pain. Respiratory: Denies shortness of breath. Gastrointestinal: no vomiting.  No diarrhea.  No constipation. Genitourinary: Negative for dysuria. Musculoskeletal: Negative for back pain. Skin: Negative for rash. Neurological: Negative for  headaches, focal weakness or numbness.  10-point ROS otherwise negative.  ____________________________________________   PHYSICAL EXAM:  VITAL SIGNS: ED Triage Vitals  Enc Vitals Group     BP 05/17/16 1110 (!) 125/59     Pulse Rate 05/17/16 1110 69     Resp 05/17/16 1110 20     Temp 05/17/16 1110 98.8 F (37.1 C)     Temp Source 05/17/16 1110 Oral     SpO2 05/17/16 1110 100 %     Weight 05/17/16 1110 165 lb (74.8 kg)     Height 05/17/16 1110 5\' 4"  (1.626 m)     Head Circumference --      Peak Flow --      Pain Score 05/17/16 1111 9     Pain Loc --      Pain Edu? --      Excl. in GC? --     Constitutional: Alert and oriented. Well appearing and in no acute distress. Eyes: Conjunctivae are normal. PERRL. EOMI. Head: Atraumatic. Nose: No congestion/rhinnorhea. Mouth/Throat: Mucous membranes are moist.   Neck: No stridor.   Cardiovascular: Normal rate, regular rhythm. Grossly normal heart sounds.   Respiratory: Normal respiratory effort.  No retractions. Lungs CTAB. Gastrointestinal: Soft With right lower quadrant tenderness at McBurney's point. There is no rebound or guarding. No distention. Right-sided CVA tenderness to palpation. Musculoskeletal: No lower extremity tenderness nor edema.  No joint effusions. Neurologic:  Normal speech and language. No gross focal neurologic deficits are appreciated.  Skin:  Skin is warm, dry and intact. No  rash noted. Psychiatric: Mood and affect are normal. Speech and behavior are normal.  ____________________________________________   LABS (all labs ordered are listed, but only abnormal results are displayed)  Labs Reviewed  CBC - Abnormal; Notable for the following:       Result Value   WBC 13.7 (*)    All other components within normal limits  URINALYSIS COMPLETEWITH MICROSCOPIC ([facility] ONLY) - Abnormal; Notable for the following:    Color, Urine YELLOW (*)    APPearance HAZY (*)    Hgb urine dipstick 2+ (*)    Protein, ur 30  (*)    Leukocytes, UA 3+ (*)    Bacteria, UA RARE (*)    Squamous Epithelial / LPF 0-5 (*)    All other components within normal limits  LIPASE, BLOOD  COMPREHENSIVE METABOLIC PANEL  POC URINE PREG, ED  POCT PREGNANCY, URINE   ____________________________________________  EKG   ____________________________________________  RADIOLOGY  CT Abdomen Pelvis W Contrast (Final result)  Result time 05/17/16 16:47:16  Final result by Natasha MeadLiviu Pop, MD (05/17/16 16:47:16)           Narrative:   CLINICAL DATA: Right flank pain, right lower quadrant pain starting this morning, nausea  EXAM: CT ABDOMEN AND PELVIS WITH CONTRAST  TECHNIQUE: Multidetector CT imaging of the abdomen and pelvis was performed using the standard protocol following bolus administration of intravenous contrast.  CONTRAST: 100mL ISOVUE-300 IOPAMIDOL (ISOVUE-300) INJECTION 61%  COMPARISON: None.  FINDINGS: Lower chest: Lung bases are normal.  Hepatobiliary: Enhanced liver is normal. No calcified gallstones are noted within gallbladder.  Pancreas: Enhanced pancreas is normal.  Spleen: Enhanced spleen is normal.  Adrenals/Urinary Tract: The the no adrenal gland mass. Enhanced kidneys are symmetrical in size. No hydronephrosis or hydroureter.  Stomach/Bowel: There is no small bowel obstruction. Moderate stool noted in right colon transverse colon descending colon. There is a low lying cecum. No pericecal inflammation. The appendix is not identified. The terminal ileum is unremarkable. No distal colonic obstruction. No colitis or diverticulitis. Moderate gas noted within mid sigmoid colon. Mild redundant sigmoid colon.  Vascular/Lymphatic: No aortic aneurysm. No adenopathy.  Reproductive: The uterus is anteflexed. No adnexal mass. Minimal congested left adnexal vessels. No pelvic free fluid.  Other: No ascites or free abdominal air. The urinary bladder is unremarkable.  Musculoskeletal: No  destructive bony lesions are noted. Sagittal images of the spine are unremarkable.  IMPRESSION: 1. There is no evidence of acute inflammatory process within abdomen. 2. No hydronephrosis or hydroureter. 3. Low lying cecum. No pericecal inflammation. The appendix is not identified. 4. Moderate stool noted in right colon transverse colon and descending colon. No colitis or diverticulitis. No distal colonic obstruction. 5. No adnexal mass. Minimal congested left adnexal vessels.   Electronically Signed By: Natasha MeadLiviu Pop M.D. On: 05/17/2016 16:47            ____________________________________________   PROCEDURES  Procedure(s) performed:   Procedures  Critical Care performed:   ____________________________________________   INITIAL IMPRESSION / ASSESSMENT AND PLAN / ED COURSE  Pertinent labs & imaging results that were available during my care of the patient were reviewed by me and considered in my medical decision making (see chart for details).  Patient with apparent UTI from her urinalysis but without her previous symptoms of UTI. We will CT scan on her to rule out appendicitis. Otherwise we'll treat for her UTI.  Clinical Course    ----------------------------------------- 5:42 PM on 05/17/2016 -----------------------------------------  No acute finding on the  CT of the abdomen and pelvis. The patient is resting without any distress at this time. Likely symptoms related to pyelonephritis. Discussed the diagnosis as well as the treatment plan with patient. She has had urine cultures in the past which have been pansensitive. Will discharge with Keflex. She understands plan and is willing to comply.  ____________________________________________   FINAL CLINICAL IMPRESSION(S) / ED DIAGNOSES  Pyelonephritis.    NEW MEDICATIONS STARTED DURING THIS VISIT:  New Prescriptions   No medications on file     Note:  This document was prepared using Dragon  voice recognition software and may include unintentional dictation errors.    Myrna Blazer, MD 05/17/16 [phone]

## 2018-04-29 IMAGING — CT CT ABD-PELV W/ CM
2 of 4 series · 15 of 46 positions shown, 17 images · IV contrast (APPLIED)
Comparison: None.

CLINICAL DATA: Right flank pain, right lower quadrant pain starting
this morning, nausea

EXAM:
CT ABDOMEN AND PELVIS WITH CONTRAST
TECHNIQUE: Multidetector CT imaging of the abdomen and pelvis was performed
using the standard protocol following bolus administration of
intravenous contrast.
CONTRAST:  100mL [id number] IOPAMIDOL (8C54FC-VHH) INJECTION 61%

[Series 2: axial st · axial · 0.66mm/px · z∈[-488,-68]mm · 12 of 92 slices shown, 14 images]
[im 4/92  soft-tissue]
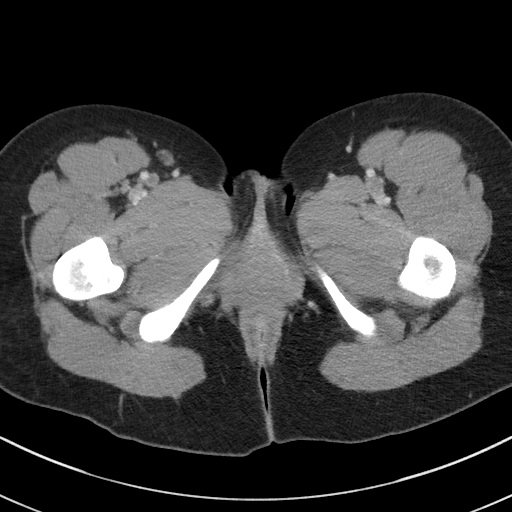
[im 4/92  bone]
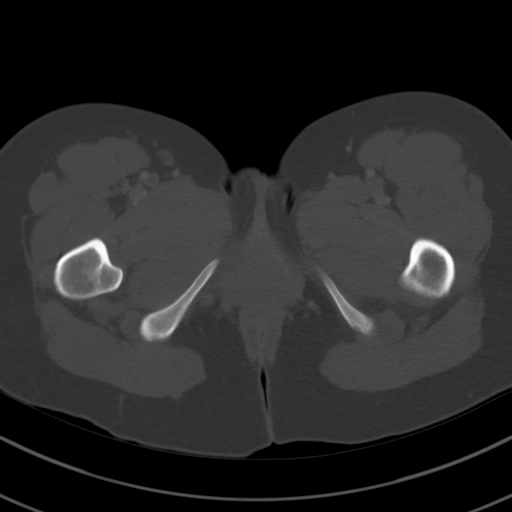
[im 12/92  soft-tissue]
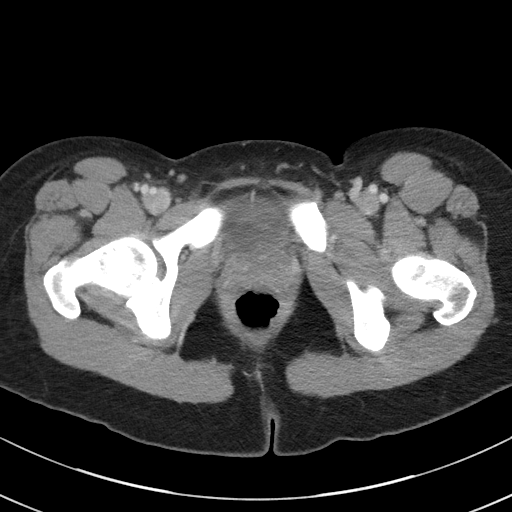
[im 20/92  soft-tissue]
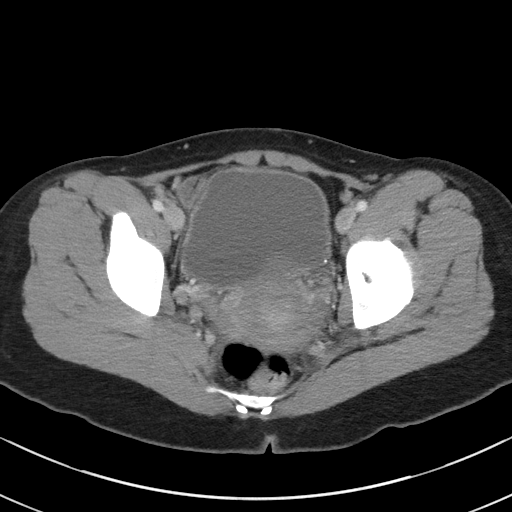
[im 28/92  soft-tissue]
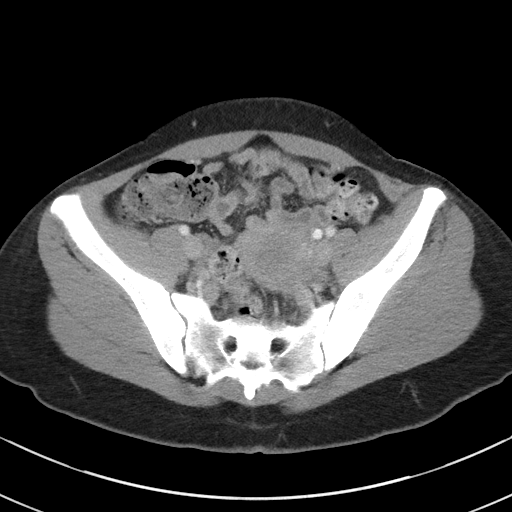
[im 36/92  soft-tissue]
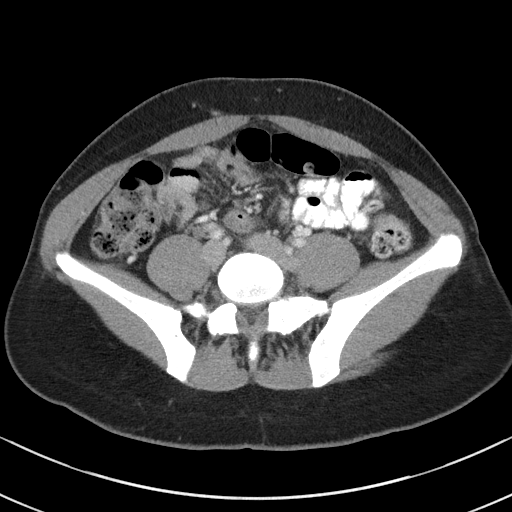
[im 44/92  soft-tissue]
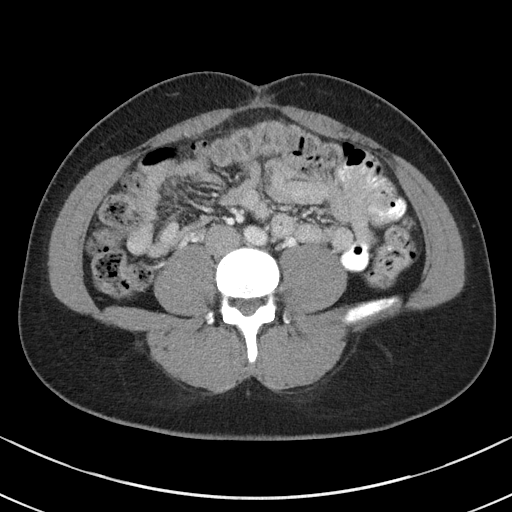
[im 48/92  soft-tissue]
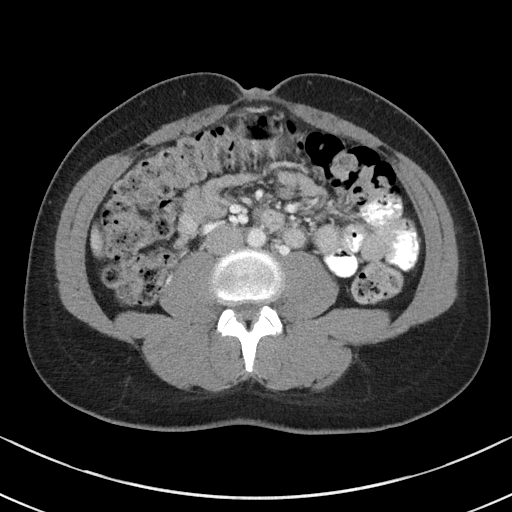
[im 56/92  soft-tissue]
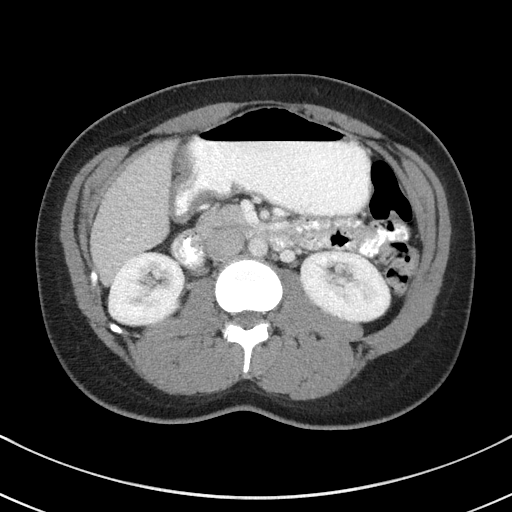
[im 64/92  soft-tissue]
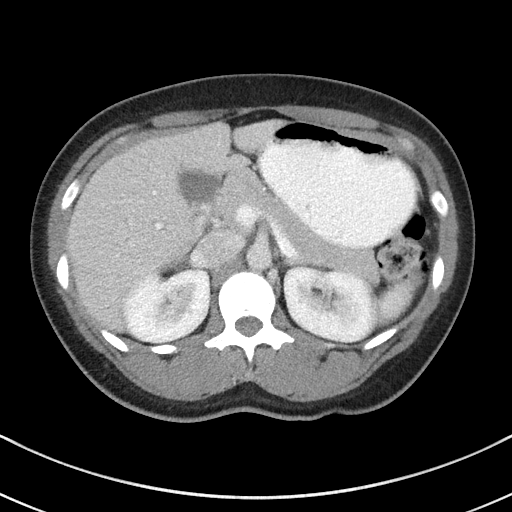
[im 64/92  bone]
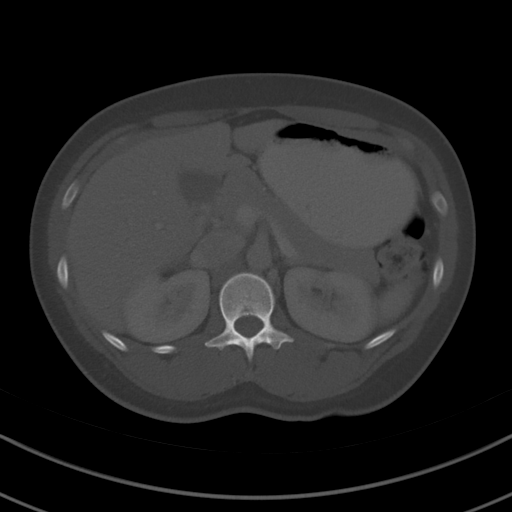
[im 72/92  soft-tissue]
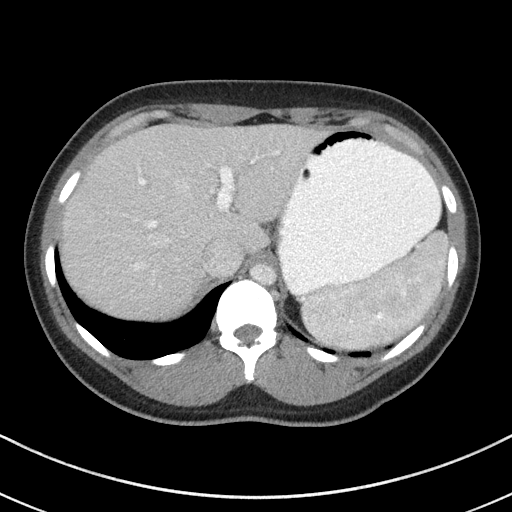
[im 80/92  soft-tissue]
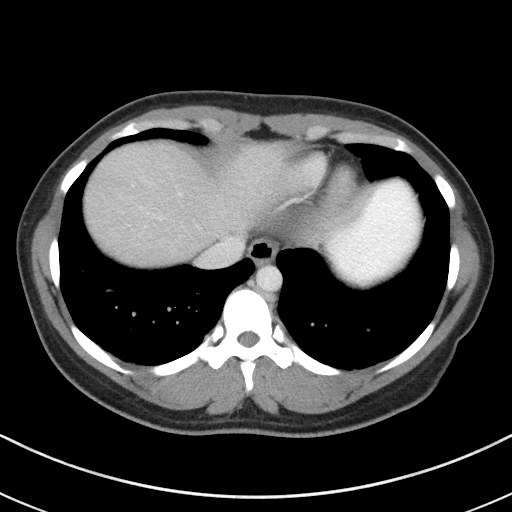
[im 88/92  soft-tissue]
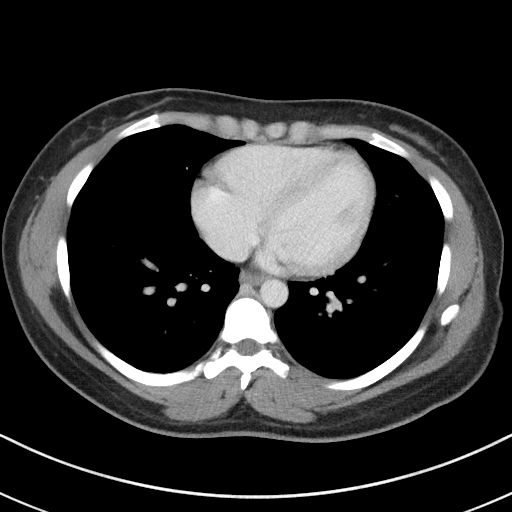

[Series 5: coronal st · coronal · 0.65mm/px · 3 of 80 slices shown]
[im 27/80  soft-tissue]
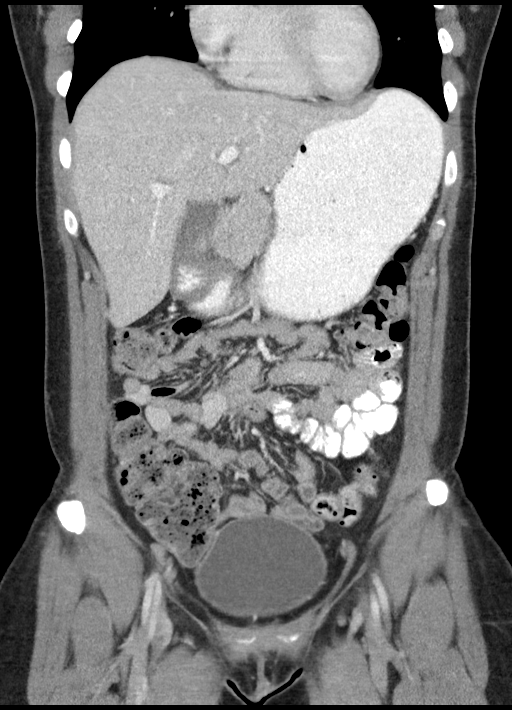
[im 36/80  soft-tissue]
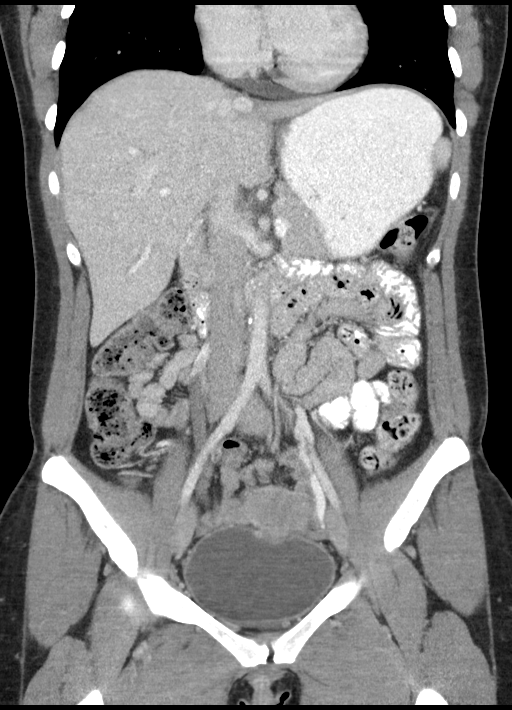
[im 44/80  soft-tissue]
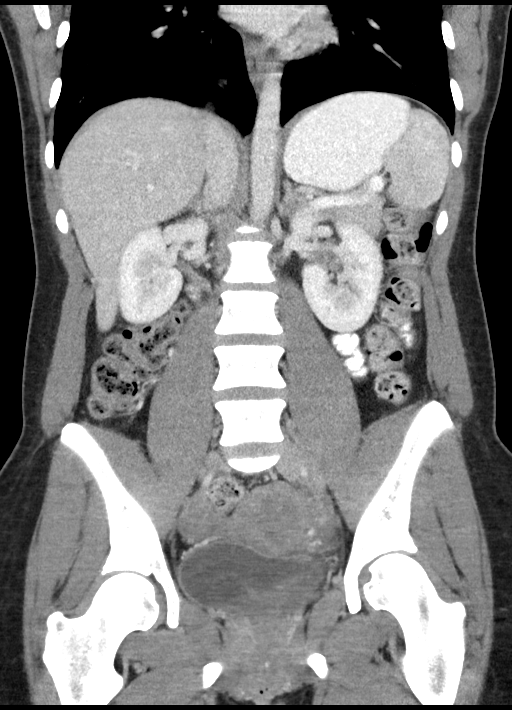

[15 of 46 positions shown; findings below may reference images not displayed]

FINDINGS: Lower chest: Lung bases are normal.

Hepatobiliary: Enhanced liver is normal. No calcified gallstones are
noted within gallbladder.

Pancreas: Enhanced pancreas is normal.

Spleen: Enhanced spleen is normal.

Adrenals/Urinary Tract: The the no adrenal gland mass. Enhanced
kidneys are symmetrical in size. No hydronephrosis or hydroureter.

Stomach/Bowel: There is no small bowel obstruction. Moderate stool
noted in right colon transverse colon descending colon. There is a
low lying cecum. No pericecal inflammation. The appendix is not
identified. The terminal ileum is unremarkable. No distal colonic
obstruction. No colitis or diverticulitis. Moderate gas noted within
mid sigmoid colon. Mild redundant sigmoid colon.

Vascular/Lymphatic: No aortic aneurysm.  No adenopathy.

Reproductive: The uterus is anteflexed. No adnexal mass. Minimal
congested left adnexal vessels. No pelvic free fluid.

Other: No ascites or free abdominal air. The urinary bladder is
unremarkable.

Musculoskeletal: No destructive bony lesions are noted. Sagittal
images of the spine are unremarkable.
IMPRESSION: 1. There is no evidence of acute inflammatory process within
abdomen.
2. No hydronephrosis or hydroureter.
3. Low lying cecum. No pericecal inflammation. The appendix is not
identified.
4. Moderate stool noted in right colon transverse colon and
descending colon. No colitis or diverticulitis. No distal colonic
obstruction.
5. No adnexal mass.  Minimal congested left adnexal vessels.

## 2019-03-28 ENCOUNTER — Other Ambulatory Visit: Payer: Self-pay

## 2019-03-28 DIAGNOSIS — Z20822 Contact with and (suspected) exposure to covid-19: Secondary | ICD-10-CM

## 2019-03-29 LAB — NOVEL CORONAVIRUS, NAA: SARS-CoV-2, NAA: NOT DETECTED

## 2022-07-08 ENCOUNTER — Ambulatory Visit
Admission: RE | Admit: 2022-07-08 | Discharge: 2022-07-08 | Disposition: A | Discharge status: Home / Self Care | Payer: 59 | Source: Ambulatory Visit | Attending: Family | Admitting: Family

## 2022-07-08 ENCOUNTER — Ambulatory Visit
Admission: RE | Admit: 2022-07-08 | Discharge: 2022-07-08 | Disposition: A | Discharge status: Home / Self Care | Payer: 59 | Attending: Family | Admitting: Family

## 2022-07-08 ENCOUNTER — Other Ambulatory Visit: Payer: Self-pay | Admitting: Family

## 2022-07-08 DIAGNOSIS — M79671 Pain in right foot: Secondary | ICD-10-CM

## 2024-03-06 ENCOUNTER — Other Ambulatory Visit: Payer: Self-pay

## 2024-03-06 ENCOUNTER — Emergency Department
Admission: EM | Admit: 2024-03-06 | Discharge: 2024-03-06 | Disposition: A | Discharge status: Home / Self Care | Attending: Emergency Medicine | Admitting: Emergency Medicine

## 2024-03-06 DIAGNOSIS — J069 Acute upper respiratory infection, unspecified: Secondary | ICD-10-CM | POA: Insufficient documentation

## 2024-03-06 DIAGNOSIS — B9789 Other viral agents as the cause of diseases classified elsewhere: Secondary | ICD-10-CM | POA: Insufficient documentation

## 2024-03-06 DIAGNOSIS — R059 Cough, unspecified: Secondary | ICD-10-CM | POA: Diagnosis present

## 2024-03-06 LAB — RESP PANEL BY RT-PCR (RSV, FLU A&B, COVID)  RVPGX2
Influenza A by PCR: NEGATIVE
Influenza B by PCR: NEGATIVE
Resp Syncytial Virus by PCR: NEGATIVE
SARS Coronavirus 2 by RT PCR: NEGATIVE

## 2024-03-06 MED ORDER — ACETAMINOPHEN 325 MG PO TABS
650.0000 mg | ORAL_TABLET | Freq: Once | ORAL | Status: AC
  Administered 2024-03-06: 650 mg via ORAL
  Filled 2024-03-06: qty 2

## 2024-03-06 NOTE — Discharge Instructions (Signed)
 Your COVID/flu/RSV swab is negative.  You likely have another viral etiology.  Please follow-up with your outpatient provider.  Please return for any new, worsening, or changing symptoms or other concerns.

## 2024-03-06 NOTE — ED Provider Notes (Signed)
 [facility] Provider Note    Event Date/Time   First MD Initiated Contact with Patient 03/06/24 1139     (approximate)   History   Cough   HPI  Gail Mcmahon is a 33 y.o. female who presents today for evaluation of cough, nasal congestion for the past 3 days.  She is unsure of any sick contacts.  She denies sore throat, chest pain, shortness breath, abdominal pain, nausea, vomiting, or diarrhea.  There are no active problems to display for this patient.         Physical Exam   Triage Vital Signs: ED Triage Vitals  Encounter Vitals Group     BP 03/06/24 1119 135/84     Girls Systolic BP Percentile --      Girls Diastolic BP Percentile --      Boys Systolic BP Percentile --      Boys Diastolic BP Percentile --      Pulse Rate 03/06/24 1119 84     Resp 03/06/24 1119 18     Temp 03/06/24 1119 98.3 F (36.8 C)     Temp src --      SpO2 03/06/24 1119 99 %     Weight 03/06/24 1120 200 lb (90.7 kg)     Height 03/06/24 1120 5' 4 (1.626 m)     Head Circumference --      Peak Flow --      Pain Score 03/06/24 1119 8     Pain Loc --      Pain Education --      Exclude from Growth Chart --     Most recent vital signs: Vitals:   03/06/24 1119 03/06/24 1305  BP: 135/84 130/79  Pulse: 84 76  Resp: 18 17  Temp: 98.3 F (36.8 C)   SpO2: 99% 100%    Physical Exam Vitals and nursing note reviewed.  Constitutional:      General: Awake and alert. No acute distress.    Appearance: Normal appearance. The patient is normal weight.  HENT:     Head: Normocephalic and atraumatic.     Mouth: Mucous membranes are moist.  Nasal congestion present Eyes:     General: PERRL. Normal EOMs        Right eye: No discharge.        Left eye: No discharge.     Conjunctiva/sclera: Conjunctivae normal.  Cardiovascular:     Rate and Rhythm: Normal rate and regular rhythm.     Pulses: Normal pulses.  Pulmonary:     Effort: Pulmonary effort is normal. No  respiratory distress.  Able to speak easily in complete sentences    Breath sounds: Normal breath sounds.  Abdominal:     Abdomen is soft. There is no abdominal tenderness. No rebound or guarding. No distention. Musculoskeletal:        General: No swelling. Normal range of motion.     Cervical back: Normal range of motion and neck supple.  Skin:    General: Skin is warm and dry.     Capillary Refill: Capillary refill takes less than 2 seconds.     Findings: No rash.  Neurological:     Mental Status: The patient is awake and alert.      ED Results / Procedures / Treatments   Labs (all labs ordered are listed, but only abnormal results are displayed) Labs Reviewed  RESP PANEL BY RT-PCR (RSV, FLU A&B, COVID)  [id number]  EKG     RADIOLOGY     PROCEDURES:  Critical Care performed:   Procedures   MEDICATIONS ORDERED IN ED: Medications  acetaminophen  (TYLENOL ) tablet 650 mg (650 mg Oral Given 03/06/24 1208)     IMPRESSION / MDM / ASSESSMENT AND PLAN / ED COURSE  I reviewed the triage vital signs and the nursing notes.   Differential diagnosis includes, but is not limited to, COVID, URI, viral syndrome.  Patient is awake and alert, hemodynamically stable and afebrile.  She is nontoxic in appearance.  She has a normal oxygen saturation 100% on room air.  Symptoms of cough and nasal congestion are most consistent with viral URI.  COVID/flu/RSV swab obtained in triage is negative.  Symptoms are consistent with other viral etiology.  I recommended symptomatic management and return precautions.  Patient understands and agrees with plan.  She was discharged in stable condition.   Patient's presentation is most consistent with acute complicated illness / injury requiring diagnostic workup.    FINAL CLINICAL IMPRESSION(S) / ED DIAGNOSES   Final diagnoses:  Viral URI with cough     Rx / DC Orders   ED Discharge Orders     None        Note:  This document  was prepared using Dragon voice recognition software and may include unintentional dictation errors.   Aloria Looper E, PA-C 03/06/24 1455    Viviann Pastor, MD 03/07/24 1140

## 2024-03-06 NOTE — ED Triage Notes (Signed)
 Pt to ED for cough, congestion, headache started Friday.
